# Patient Record
Sex: Female | Born: 1985 | Race: Black or African American | Hispanic: No | Marital: Single | Smoking: Current every day smoker
Health system: Southern US, Community
[De-identification: ages and names within clinical notes are randomized; demographics above are authoritative.]

## PROBLEM LIST (undated history)

## (undated) DIAGNOSIS — D649 Anemia, unspecified: Secondary | ICD-10-CM

## (undated) DIAGNOSIS — J45909 Unspecified asthma, uncomplicated: Secondary | ICD-10-CM

---

## 2020-02-18 ENCOUNTER — Observation Stay
Admission: EM | Admit: 2020-02-18 | Discharge: 2020-02-18 | Disposition: A | Discharge status: Home / Self Care | Payer: Medicaid Other | Attending: Obstetrics and Gynecology | Admitting: Obstetrics and Gynecology

## 2020-02-18 ENCOUNTER — Encounter: Payer: Self-pay | Admitting: Obstetrics and Gynecology

## 2020-02-18 ENCOUNTER — Other Ambulatory Visit: Payer: Self-pay

## 2020-02-18 DIAGNOSIS — F17200 Nicotine dependence, unspecified, uncomplicated: Secondary | ICD-10-CM | POA: Insufficient documentation

## 2020-02-18 DIAGNOSIS — R109 Unspecified abdominal pain: Secondary | ICD-10-CM | POA: Diagnosis present

## 2020-02-18 DIAGNOSIS — O26893 Other specified pregnancy related conditions, third trimester: Secondary | ICD-10-CM | POA: Diagnosis present

## 2020-02-18 DIAGNOSIS — R102 Pelvic and perineal pain: Secondary | ICD-10-CM | POA: Diagnosis not present

## 2020-02-18 DIAGNOSIS — O99333 Smoking (tobacco) complicating pregnancy, third trimester: Secondary | ICD-10-CM | POA: Insufficient documentation

## 2020-02-18 DIAGNOSIS — Z20822 Contact with and (suspected) exposure to covid-19: Secondary | ICD-10-CM | POA: Diagnosis not present

## 2020-02-18 DIAGNOSIS — R1011 Right upper quadrant pain: Secondary | ICD-10-CM | POA: Insufficient documentation

## 2020-02-18 DIAGNOSIS — Z3490 Encounter for supervision of normal pregnancy, unspecified, unspecified trimester: Secondary | ICD-10-CM

## 2020-02-18 DIAGNOSIS — Z3A36 36 weeks gestation of pregnancy: Secondary | ICD-10-CM | POA: Diagnosis not present

## 2020-02-18 HISTORY — DX: Anemia, unspecified: D64.9

## 2020-02-18 HISTORY — DX: Unspecified asthma, uncomplicated: J45.909

## 2020-02-18 LAB — URINALYSIS, COMPLETE (UACMP) WITH MICROSCOPIC
Bilirubin Urine: NEGATIVE
Glucose, UA: NEGATIVE mg/dL
Hgb urine dipstick: NEGATIVE
Ketones, ur: NEGATIVE mg/dL
Leukocytes,Ua: NEGATIVE
Nitrite: NEGATIVE
Protein, ur: NEGATIVE mg/dL
Specific Gravity, Urine: 1.011 (ref 1.005–1.030)
pH: 7 (ref 5.0–8.0)

## 2020-02-18 LAB — COMPREHENSIVE METABOLIC PANEL
ALT: 14 U/L (ref 0–44)
AST: 20 U/L (ref 15–41)
Albumin: 2.9 g/dL — ABNORMAL LOW (ref 3.5–5.0)
Alkaline Phosphatase: 82 U/L (ref 38–126)
Anion gap: 10 (ref 5–15)
BUN: 5 mg/dL — ABNORMAL LOW (ref 6–20)
CO2: 22 mmol/L (ref 22–32)
Calcium: 8.8 mg/dL — ABNORMAL LOW (ref 8.9–10.3)
Chloride: 104 mmol/L (ref 98–111)
Creatinine, Ser: 0.42 mg/dL — ABNORMAL LOW (ref 0.44–1.00)
GFR, Estimated: >60 mL/min (ref >60)
Glucose, Bld: 70 mg/dL (ref 70–99)
Potassium: 3.4 mmol/L — ABNORMAL LOW (ref 3.5–5.1)
Sodium: 136 mmol/L (ref 135–145)
Total Bilirubin: 0.9 mg/dL (ref 0.3–1.2)
Total Protein: 6.4 g/dL — ABNORMAL LOW (ref 6.5–8.1)

## 2020-02-18 LAB — WET PREP, GENITAL
Clue Cells Wet Prep HPF POC: NONE SEEN
Sperm: NONE SEEN
Trich, Wet Prep: NONE SEEN
Yeast Wet Prep HPF POC: NONE SEEN

## 2020-02-18 LAB — PROTEIN / CREATININE RATIO, URINE
Creatinine, Urine: 100 mg/dL
Protein Creatinine Ratio: 0.14 mg/mg{Cre} (ref 0.00–0.15)
Total Protein, Urine: 14 mg/dL

## 2020-02-18 LAB — CBC WITH DIFFERENTIAL/PLATELET
Abs Immature Granulocytes: 0.02 10*3/uL (ref 0.00–0.07)
Basophils Absolute: 0 10*3/uL (ref 0.0–0.1)
Basophils Relative: 0 %
Eosinophils Absolute: 0.1 10*3/uL (ref 0.0–0.5)
Eosinophils Relative: 1 %
HCT: 32.5 % — ABNORMAL LOW (ref 36.0–46.0)
Hemoglobin: 10.8 g/dL — ABNORMAL LOW (ref 12.0–15.0)
Immature Granulocytes: 0 %
Lymphocytes Relative: 35 %
Lymphs Abs: 1.7 10*3/uL (ref 0.7–4.0)
MCH: 29.3 pg (ref 26.0–34.0)
MCHC: 33.2 g/dL (ref 30.0–36.0)
MCV: 88.3 fL (ref 80.0–100.0)
Monocytes Absolute: 0.6 10*3/uL (ref 0.1–1.0)
Monocytes Relative: 11 %
Neutro Abs: 2.5 10*3/uL (ref 1.7–7.7)
Neutrophils Relative %: 53 %
Platelets: 160 10*3/uL (ref 150–400)
RBC: 3.68 MIL/uL — ABNORMAL LOW (ref 3.87–5.11)
RDW: 13.5 % (ref 11.5–15.5)
WBC: 4.8 10*3/uL (ref 4.0–10.5)
nRBC: 0 % (ref 0.0–0.2)

## 2020-02-18 LAB — RESP PANEL BY RT-PCR (FLU A&B, COVID) ARPGX2
Influenza A by PCR: NEGATIVE
Influenza B by PCR: NEGATIVE
SARS Coronavirus 2 by RT PCR: NEGATIVE

## 2020-02-18 LAB — CHLAMYDIA/NGC RT PCR (ARMC ONLY)
Chlamydia Tr: NOT DETECTED
N gonorrhoeae: NOT DETECTED

## 2020-02-18 LAB — TYPE AND SCREEN
ABO/RH(D): A POS
Antibody Screen: NEGATIVE

## 2020-02-18 LAB — URINE DRUG SCREEN, QUALITATIVE (ARMC ONLY)
Amphetamines, Ur Screen: NOT DETECTED
Barbiturates, Ur Screen: NOT DETECTED
Benzodiazepine, Ur Scrn: NOT DETECTED
Cannabinoid 50 Ng, Ur ~~LOC~~: POSITIVE — AB
Cocaine Metabolite,Ur ~~LOC~~: NOT DETECTED
MDMA (Ecstasy)Ur Screen: NOT DETECTED
Methadone Scn, Ur: NOT DETECTED
Opiate, Ur Screen: NOT DETECTED
Phencyclidine (PCP) Ur S: NOT DETECTED
Tricyclic, Ur Screen: NOT DETECTED

## 2020-02-18 LAB — ABO/RH: ABO/RH(D): A POS

## 2020-02-18 LAB — HEPATITIS B SURFACE ANTIGEN: Hepatitis B Surface Ag: NONREACTIVE

## 2020-02-18 LAB — RAPID HIV SCREEN (HIV 1/2 AB+AG)
HIV 1/2 Antibodies: NONREACTIVE
HIV-1 P24 Antigen - HIV24: NONREACTIVE

## 2020-02-18 MED ORDER — ONDANSETRON HCL 4 MG/2ML IJ SOLN
INTRAMUSCULAR | Status: AC
  Administered 2020-02-18: 4 mg
  Filled 2020-02-18: qty 2

## 2020-02-18 MED ORDER — MORPHINE SULFATE (PF) 2 MG/ML IV SOLN
2.0000 mg | Freq: Once | INTRAVENOUS | Status: AC
  Administered 2020-02-18: 2 mg via INTRAVENOUS
  Filled 2020-02-18: qty 1

## 2020-02-18 MED ORDER — MORPHINE SULFATE (PF) 2 MG/ML IV SOLN
INTRAVENOUS | Status: AC
  Administered 2020-02-18: 2 mg via INTRAVENOUS
  Filled 2020-02-18: qty 1

## 2020-02-18 MED ORDER — ONDANSETRON HCL 4 MG/2ML IJ SOLN: 4.0000 mg | Freq: Four times a day (QID) | INTRAMUSCULAR | Status: DC

## 2020-02-18 MED ORDER — FAMOTIDINE 20 MG PO TABS
ORAL_TABLET | ORAL | Status: AC
  Filled 2020-02-18: qty 2

## 2020-02-18 MED ORDER — FAMOTIDINE 20 MG PO TABS
40.0000 mg | ORAL_TABLET | Freq: Once | ORAL | Status: AC
  Administered 2020-02-18: 40 mg via ORAL

## 2020-02-18 MED ORDER — LACTATED RINGERS IV SOLN: INTRAVENOUS | Status: DC

## 2020-02-18 MED ORDER — MORPHINE SULFATE (PF) 4 MG/ML IV SOLN: 8.0000 mg | Freq: Once | INTRAVENOUS | Status: AC

## 2020-02-18 MED ORDER — MORPHINE SULFATE (PF) 4 MG/ML IV SOLN
INTRAVENOUS | Status: AC
  Administered 2020-02-18: 8 mg via INTRAMUSCULAR
  Filled 2020-02-18: qty 1

## 2020-02-18 MED ORDER — FAMOTIDINE 40 MG/5ML PO SUSR
40.0000 mg | Freq: Every day | ORAL | Status: DC
  Filled 2020-02-18: qty 5

## 2020-02-18 MED ORDER — MORPHINE SULFATE (PF) 2 MG/ML IV SOLN: 2.0000 mg | Freq: Once | INTRAVENOUS | Status: AC

## 2020-02-18 MED ORDER — BETAMETHASONE SOD PHOS & ACET 6 (3-3) MG/ML IJ SUSP
INTRAMUSCULAR | Status: AC
  Administered 2020-02-18: 12 mg via INTRAMUSCULAR
  Filled 2020-02-18: qty 5

## 2020-02-18 MED ORDER — BETAMETHASONE SOD PHOS & ACET 6 (3-3) MG/ML IJ SUSP
12.0000 mg | INTRAMUSCULAR | Status: DC
Start: 2020-02-18 — End: 2020-02-19

## 2020-02-18 MED ORDER — MORPHINE SULFATE (PF) 4 MG/ML IV SOLN
INTRAVENOUS | Status: AC
  Filled 2020-02-18: qty 1

## 2020-02-18 MED ORDER — ACETAMINOPHEN-CODEINE #3 300-30 MG PO TABS: 1.0000 | ORAL_TABLET | Freq: Four times a day (QID) | ORAL | 0 refills | Status: DC | PRN

## 2020-02-18 NOTE — Discharge Instructions (Signed)
Abdominal Pain During Pregnancy  Abdominal pain is common during pregnancy, and has many possible causes. Some causes are more serious than others, and sometimes the cause is not known. Abdominal pain can be a sign that labor is starting. It can also be caused by normal growth and stretching of muscles and ligaments during pregnancy. Always tell your health care provider if you have any abdominal pain. Follow these instructions at home:  Do not have sex or put anything in your vagina until your pain goes away completely.  Get plenty of rest until your pain improves.  Drink enough fluid to keep your urine pale yellow.  Take over-the-counter and prescription medicines only as told by your health care provider.  Keep all follow-up visits as told by your health care provider. This is important. Contact a health care provider if:  Your pain continues or gets worse after resting.  You have lower abdominal pain that: ? Comes and goes at regular intervals. ? Spreads to your back. ? Is similar to menstrual cramps.  You have pain or burning when you urinate. Get help right away if:  You have a fever or chills.  You have vaginal bleeding.  You are leaking fluid from your vagina.  You are passing tissue from your vagina.  You have vomiting or diarrhea that lasts for more than 24 hours.  Your baby is moving less than usual.  You feel very weak or faint.  You have shortness of breath.  You develop severe pain in your upper abdomen. Summary  Abdominal pain is common during pregnancy, and has many possible causes.  If you experience abdominal pain during pregnancy, tell your health care provider right away.  Follow your health care provider's home care instructions and keep all follow-up visits as directed. This information is not intended to replace advice given to you by your health care provider. Make sure you discuss any questions you have with your health care  provider. Document Revised: 05/18/2018 Document Reviewed: 05/02/2016 Elsevier Patient Education  2020 [employer]vier Inc.  

## 2020-02-18 NOTE — OB Triage Note (Signed)
Pt is a G5P4 at [redacted]w[redacted]d that present from ED with complaint of Abdominal pain that started this morning at 10am. Pt describes pain as "starting in the top of my abdomen and shoots to my vagina and feels like my butt vibrates." Pt denies LOF, VB and states positive FM. Pt had a C/S with her last pregnancy due to breech.

## 2020-02-18 NOTE — H&P (Signed)
Obstetric H&P   Chief Complaint: Abdominal pain, pelvic pain  Prenatal Care Provider: None locally - Patient recent transfer from [place]  History of Present Illness: 35 y.o. [id number] [redacted]w[redacted]d by 03/14/2020, by Last Menstrual Period presenting to L&D with concern for RUQ abdominal pain that radiates through abdomen to pelvis and vagina. Patient states she has been peeing more frequently the last two days and this morning woke with intense pain in her vagina and abdomen.  Patient reports +FM, denies VB or LOF. During initial evaluation, patient tearful and very agitated with frequent movement in bed, difficulty responding to questions regarding history. Patient states she has recently moved from [place] but has not established [facility] care due to insurance restrictions. Patient states she has not been seen for [facility] since [redacted] weeks gestation. Patient reports frequent urination but denies burning or pain with voiding. Patient denies additional S&S of PIH including headache or changes in vision. Denies N/V/D. Patient reports being treated for gonorrhea during this pregnancy. Reports unprotected sex with 1 partner, states she "thinks" he was treated for gonorrhea as well. Patient reports hx of asthma and anemia but no additional complications during this pregnancy. Patient states she has a hx of one preterm delivery that was indicated for recurrent UTIs in pregnancy. Patient states she has had one previous cesarean delivery for breech presentation.   Pregravid weight Pregravid weight not on file Total Weight Gain Not found.  pregnancy Problems (from 02/18/20 to present)    No problems associated with this episode.       Review of Systems: 10 point review of systems negative unless otherwise noted in HPI  Past Medical History: Patient Active Problem List   Diagnosis Date Noted  . Pregnancy 02/18/2020    Past Surgical History: Past Surgical History:  Procedure Laterality Date  . CESAREAN SECTION       Past Obstetric History: # 1 - Date: 10/01/06, Sex: Female, Weight: None, GA: [redacted]w[redacted]d, Delivery: Vaginal, Spontaneous, Apgar1: None, Apgar5: None, Living: Living, Birth Comments: None  # 2 - Date: 06/15/09, Sex: Female, Weight: None, GA: None, Delivery: Vaginal, Spontaneous, Apgar1: None, Apgar5: None, Living: Living, Birth Comments: None  # 3 - Date: 04/21/12, Sex: Female, Weight: None, GA: None, Delivery: Vaginal, Spontaneous, Apgar1: None, Apgar5: None, Living: Living, Birth Comments: None  # 4 - Date: 12/04/13, Sex: Female, Weight: None, GA: None, Delivery: C-Section, Low Transverse, Apgar1: None, Apgar5: None, Living: Living, Birth Comments: None  # 5 - Date: None, Sex: None, Weight: None, GA: None, Delivery: None, Apgar1: None, Apgar5: None, Living: None, Birth Comments: None   Past Gynecologic History:  Family History: History reviewed. No pertinent family history.  Social History: Social History   Socioeconomic History  . Marital status: Significant Other    Spouse name: Antonio  . Number of children: Not on file  . Years of education: Not on file  . Highest education level: Not on file  Occupational History  . Not on file  Tobacco Use  . Smoking status: Current Some Day Smoker    Last attempt to quit: 01/31/2020    Years since quitting: 0.0  . Smokeless tobacco: Never Used  Vaping Use  . Vaping Use: Not on file  Substance and Sexual Activity  . Alcohol use: Never  . Drug use: Not Currently  . Sexual activity: Yes    Birth control/protection: I.U.D.  Other Topics Concern  . Not on file  Social History Narrative  . Not on file   Social  Determinants of Health   Financial Resource Strain: Not on file  Food Insecurity: Not on file  Transportation Needs: Not on file  Physical Activity: Not on file  Stress: Not on file  Social Connections: Not on file  Intimate Partner Violence: Not on file    Medications: Prior to Admission medications   Medication Sig  Start Date End Date Taking? Authorizing Provider  ferrous sulfate 325 (65 FE) MG EC tablet Take 325 mg by mouth 3 (three) times daily with meals.   Yes [provider]  Prenatal Vit-Fe Fumarate-FA (PRENATAL MULTIVITAMIN) TABS tablet Take 1 tablet by mouth daily at 12 noon.   Yes [provider]    Allergies: Allergies  Allergen Reactions  . Shellfish Allergy Swelling    Swelling of tongue  . Shiitake Mushroom Hives  . Bee Venom Rash    Physical Exam: Vitals: Blood pressure 118/74, pulse 87, temperature 98.2 F (36.8 C), resp. rate 18, height 5' (1.524 m), weight 56.3 kg, last menstrual period 06/08/2019.  NONSTRESS TEST INTERPRETATION  INDICATIONS: rule out uterine contractions FHR baseline: 130 RESULTS:  A NST procedure was performed with FHR monitoring and a normal baseline established, appropriate time of 20-40 minutes of evaluation, and accels >2 seen w 15x15 characteristics.  Results show a REACTIVE NST.  Toco: Irregular contraction pattern noted with intermittent uterine irritability   General: Tearful and agitated with frequent movement in bed HEENT: normocephalic, anicteric Pulmonary: No increased work of breathing Cardiovascular: RRR, distal pulses 2+ Abdomen: Gravid, slight tenderness to palpation of RUQ Genitourinary: SVE: 0.5/thick/posterior/-3 (cephalic presentation confirmed by bedside US) Extremities: no edema, erythema, or tenderness Neurologic: Grossly intact Psychiatric: mood appropriate, affect full  Labs: Results for orders placed or performed during the hospital encounter of 02/18/20 (from the past 24 hour(s))  Urinalysis, Complete w Microscopic Urine, Clean Catch     Status: Abnormal   Collection Time: 02/18/20  2:19 PM  Result Value Ref Range   Color, Urine YELLOW (A) YELLOW   APPearance HAZY (A) CLEAR   Specific Gravity, Urine 1.011 1.005 - 1.030   pH 7.0 5.0 - 8.0   Glucose, UA NEGATIVE NEGATIVE mg/dL   Hgb urine dipstick  NEGATIVE NEGATIVE   Bilirubin Urine NEGATIVE NEGATIVE   Ketones, ur NEGATIVE NEGATIVE mg/dL   Protein, ur NEGATIVE NEGATIVE mg/dL   Nitrite NEGATIVE NEGATIVE   Leukocytes,Ua NEGATIVE NEGATIVE   RBC / HPF 0-5 0 - 5 RBC/hpf   WBC, UA 0-5 0 - 5 WBC/hpf   Bacteria, UA RARE (A) NONE SEEN   Squamous Epithelial / LPF 11-20 0 - 5   Mucus PRESENT   Wet prep, genital     Status: Abnormal   Collection Time: 02/18/20  2:19 PM   Specimen: Urine, Clean Catch  Result Value Ref Range   Yeast Wet Prep HPF POC NONE SEEN NONE SEEN   Trich, Wet Prep NONE SEEN NONE SEEN   Clue Cells Wet Prep HPF POC NONE SEEN NONE SEEN   WBC, Wet Prep HPF POC FEW (A) NONE SEEN   Sperm NONE SEEN   CBC with Differential/Platelet     Status: Abnormal   Collection Time: 02/18/20  2:28 PM  Result Value Ref Range   WBC 4.8 4.0 - 10.5 K/uL   RBC 3.68 (L) 3.87 - 5.11 MIL/uL   Hemoglobin 10.8 (L) 12.0 - 15.0 g/dL   HCT 16.1 (L) 09.6 - 04.5 %   MCV 88.3 80.0 - 100.0 fL   MCH 29.3 26.0 -  34.0 pg   MCHC 33.2 30.0 - 36.0 g/dL   RDW 13.5 11.5 - 15.5 %   Platelets 160 150 - 400 K/uL   nRBC 0.0 0.0 - 0.2 %   Neutrophils Relative % 53 %   Neutro Abs 2.5 1.7 - 7.7 K/uL   Lymphocytes Relative 35 %   Lymphs Abs 1.7 0.7 - 4.0 K/uL   Monocytes Relative 11 %   Monocytes Absolute 0.6 0.1 - 1.0 K/uL   Eosinophils Relative 1 %   Eosinophils Absolute 0.1 0.0 - 0.5 K/uL   Basophils Relative 0 %   Basophils Absolute 0.0 0.0 - 0.1 K/uL   Immature Granulocytes 0 %   Abs Immature Granulocytes 0.02 0.00 - 0.07 K/uL  Comprehensive metabolic panel     Status: Abnormal   Collection Time: 02/18/20  2:28 PM  Result Value Ref Range   Sodium 136 135 - 145 mmol/L   Potassium 3.4 (L) 3.5 - 5.1 mmol/L   Chloride 104 98 - 111 mmol/L   CO2 22 22 - 32 mmol/L   Glucose, Bld 70 70 - 99 mg/dL   BUN 5 (L) 6 - 20 mg/dL   Creatinine, Ser 0.42 (L) 0.44 - 1.00 mg/dL   Calcium 8.8 (L) 8.9 - 10.3 mg/dL   Total Protein 6.4 (L) 6.5 - 8.1 g/dL   Albumin  2.9 (L) 3.5 - 5.0 g/dL   AST 20 15 - 41 U/L   ALT 14 0 - 44 U/L   Alkaline Phosphatase 82 38 - 126 U/L   Total Bilirubin 0.9 0.3 - 1.2 mg/dL   GFR, Estimated >60 >60 mL/min   Anion gap 10 5 - 15  Type and screen [place]     Status: None   Collection Time: 02/18/20  2:28 PM  Result Value Ref Range   ABO/RH(D) A POS    Antibody Screen NEG    Sample Expiration      02/21/2020,2359 Performed at [facility], [place], [place] [address]   Rapid HIV screen (HIV 1/2 Ab+Ag)     Status: None   Collection Time: 02/18/20  2:28 PM  Result Value Ref Range   HIV-1 P24 Antigen - HIV24 NON REACTIVE NON REACTIVE   HIV 1/2 Antibodies NON REACTIVE NON REACTIVE   Interpretation (HIV Ag Ab)      A non reactive test result means that HIV 1 or HIV 2 antibodies and HIV 1 p24 antigen were not detected in the specimen.    Assessment: 35 y.o. [id number] 103w3d by 03/14/2020, by Last Menstrual Period who presents for acute abdominal and pelvic pain. Irregular contraction pattern with closed cervix- not currently in labor. Will place in observation for further evaluation. Obstetric hx not immediately available due to recent out of state move.   Plan: 1) Abd and pelvic pain- CBC/CMP, UA, GC/CT, wet mount collected. IV fluids and pain medication for acute discomfort.   2) Fetus - Category I - reassuring fetal status  3) Prenatal lab panel ordered  4) Disposition - Pending further evaluation.   5) Record request placed from site of [facility] in [place].  Orlie Pollen, CNM, MSN [facility], [facility] 02/18/2020, 4:09 PM

## 2020-02-18 NOTE — OB Triage Note (Signed)
Discharge instructions reviewed and pt verbalized understanding. Pt made aware to return to labor & delivery at 5:45pm tomorrow 02/19/19 for repeat betamethasone shot. Pt verbalized understanding.

## 2020-02-18 NOTE — Discharge Summary (Signed)
Physician Discharge Summary  [name] MRN: [id number] DOB/AGE: 34-09-1985 34 y.o.  Admit date: 02/18/2020 Discharge date: 02/18/2020  Admission Diagnoses: Abdominal pain in pregnancy  Discharge Diagnoses:  Active Problems:   Pregnancy   Discharged Condition: good  Hospital Course: Patient was admitted for severe abdominal pain. She was having contractions. She was observed for several hours. She was given IV and IM pain medication. This resolved her contractions and pain. She did not make cervical change. She was given betamethasone. Evaluation was negative for other sources of abdominal pain.  She was able to be discharged home in stable condition. Encouraged to establish prenatal care. Return tomorrow for second betamethasone. Rx for tylenol #3 sent. 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   Consults: None  Significant Diagnostic Studies: labs: See EPIC  Treatments: IV hydration  Discharge Exam: Blood pressure 120/62, pulse 75, temperature 98.2 F (36.8 C), resp. rate 18, height 5' (1.524 m), weight 56.3 kg, last menstrual period 06/08/2019. General appearance: alert and cooperative Resp: clear to auscultation bilaterally Chest wall: no tenderness Cardio: regular rate and rhythm, S1, S2 normal, no murmur, click, rub or gallop GI: soft, non-tender; bowel sounds normal; no masses,  no organomegaly Extremities: extremities normal, atraumatic, no cyanosis or edema Pulses: 2+ and symmetric Skin: Skin color, texture, turgor normal. No rashes or lesions  Disposition: Discharge disposition: 01-Home or Self Care        Allergies as of 02/18/2020      Reactions   Shellfish Allergy Swelling   Swelling of tongue   Shiitake Mushroom Hives   Bee Venom Rash      Medication List    TAKE these medications   acetaminophen-codeine 300-30 MG tablet Commonly known as: TYLENOL #3 Take 1 tablet by mouth every 6 (six) hours as needed for moderate  pain.   ferrous sulfate 325 (65 FE) MG EC tablet Take 325 mg by mouth 3 (three) times daily with meals.   prenatal multivitamin Tabs tablet Take 1 tablet by mouth daily at 12 noon.       Follow-up Information    [facility]. Schedule an appointment as soon as possible for a visit in 3 day(s).   Contact information: [address] [place] [address] [phone]              Signed: Natale Milch 02/18/2020, 9:03 PM

## 2020-02-19 ENCOUNTER — Observation Stay
Admission: EM | Admit: 2020-02-19 | Discharge: 2020-02-19 | Disposition: A | Discharge status: Home / Self Care | Payer: Medicaid Other | Attending: Obstetrics & Gynecology | Admitting: Obstetrics & Gynecology

## 2020-02-19 DIAGNOSIS — R109 Unspecified abdominal pain: Secondary | ICD-10-CM

## 2020-02-19 DIAGNOSIS — O26893 Other specified pregnancy related conditions, third trimester: Secondary | ICD-10-CM

## 2020-02-19 DIAGNOSIS — Z3684 Encounter for antenatal screening for fetal lung maturity: Principal | ICD-10-CM | POA: Insufficient documentation

## 2020-02-19 DIAGNOSIS — Z3A00 Weeks of gestation of pregnancy not specified: Secondary | ICD-10-CM | POA: Insufficient documentation

## 2020-02-19 LAB — RUBELLA SCREEN: Rubella: <0.9 index — ABNORMAL LOW (ref >0.99)

## 2020-02-19 LAB — RPR: RPR Ser Ql: NONREACTIVE

## 2020-02-19 MED ORDER — BETAMETHASONE SOD PHOS & ACET 6 (3-3) MG/ML IJ SUSP
12.0000 mg | Freq: Once | INTRAMUSCULAR | Status: AC
  Administered 2020-02-19: 12 mg via INTRAMUSCULAR

## 2020-02-19 MED ORDER — BETAMETHASONE SOD PHOS & ACET 6 (3-3) MG/ML IJ SUSP: 12.0000 mg | Freq: Once | INTRAMUSCULAR | Status: DC

## 2020-02-19 NOTE — OB Triage Note (Signed)
Pt presents for her second BMZ injection and provider aware of patients arrival. Discharge orders received.

## 2020-02-19 NOTE — OB Triage Note (Signed)
Pt discharged home.

## 2020-02-19 NOTE — Discharge Summary (Signed)
Visit for second BMZ injection due to risk factors for PTL  Annamarie Major, MD, [facility], [facility] 02/19/2020  9:17 PM

## 2020-02-20 ENCOUNTER — Inpatient Hospital Stay
Admission: EM | Admit: 2020-02-20 | Discharge: 2020-02-20 | Disposition: A | Discharge status: Home / Self Care | Payer: Medicaid Other | Attending: Obstetrics & Gynecology | Admitting: Obstetrics & Gynecology

## 2020-02-20 ENCOUNTER — Other Ambulatory Visit: Payer: Self-pay

## 2020-02-20 ENCOUNTER — Encounter: Payer: Self-pay | Admitting: Obstetrics & Gynecology

## 2020-02-20 DIAGNOSIS — O26893 Other specified pregnancy related conditions, third trimester: Secondary | ICD-10-CM

## 2020-02-20 DIAGNOSIS — R109 Unspecified abdominal pain: Secondary | ICD-10-CM

## 2020-02-20 DIAGNOSIS — O26853 Spotting complicating pregnancy, third trimester: Secondary | ICD-10-CM | POA: Insufficient documentation

## 2020-02-20 DIAGNOSIS — Z3A36 36 weeks gestation of pregnancy: Secondary | ICD-10-CM | POA: Insufficient documentation

## 2020-02-20 DIAGNOSIS — O4693 Antepartum hemorrhage, unspecified, third trimester: Secondary | ICD-10-CM

## 2020-02-20 LAB — CULTURE, BETA STREP (GROUP B ONLY)

## 2020-02-20 LAB — URINE CULTURE

## 2020-02-20 NOTE — Progress Notes (Signed)
Pt discharged home per Tiburcio Pea, MDorder.  Pt stable and ambulatory. An After Visit Summary was printed and given to the patient. Discharge education completed with patient/family including follow up instructions, medication list, d/c activities limitations if indicated, with other d/c instructions as indicated by MD . Pt received labor and bleeding precautions. Patient able to verbalize understanding, all questions fully answered. Pt educated on no longer checking her cervix.  Patient instructed to return to ED, call 911, or call MD for any changes in condition. Pt discharged home via personal vehicle with support person.

## 2020-02-20 NOTE — Discharge Summary (Signed)
  See FPN 

## 2020-02-20 NOTE — OB Triage Note (Addendum)
Pt Elizabeth Mcneil 35 y.o. presents to the ED complaining of ctx, vaginal spotting at 0300, pelvic pressure, and nausea . Pt is a [id number] at [redacted]w[redacted]d . Pt denies signs and symptons consistent with rupture of membranes or active vaginal bleeding. Pt denies contractions but reports "I feel her head on my pelvic bone and so much pressure" and states positive fetal movement. Pt reports lower mid pelvic pain 7/10. Pt states she attempted to check her cervix and while checking her cervix she scratched inside of her vagina and her a small amount of bleeding. Pt showed RN picture of the vaginal bleeding she noted at home. Bleeding was scant and brown in color. No bleeding noted in L&D when pt wiped.  External FM and TOCO applied to non-tender abdomen and assessing. Initial FHR 130 . Vital signs obtained and within normal limits. Provider notified of pt.

## 2020-02-20 NOTE — Final Progress Note (Signed)
Physician Final Progress Note  [name] MRN: [id number] DOB/AGE: [birth year] 34 y.o.  Admit date: 02/20/2020 Admitting provider: Nadara Mustard, MD Discharge date: 02/20/2020   Admission Diagnoses: Pain and bleeding in third trimester  Discharge Diagnoses: same  Consults: None  Significant Findings/ Diagnostic Studies: Patient presented for evaluation of labor.  Patient had cervical exam by RN and this was reported to me. I reviewed her vital signs and fetal tracing, both of which were reassuring.  Patient was discharge as she was not laboring.  Procedures: A NST procedure was performed with FHR monitoring and a normal baseline established, appropriate time of 20-40 minutes of evaluation, and accels >2 seen w 15x15 characteristics.  Results show a REACTIVE NST.   Discharge Condition: good  Disposition: Discharge disposition: 01-Home or Self Care       Diet: Regular diet  Discharge Activity: Activity as tolerated   Allergies as of 02/20/2020      Reactions   Shellfish Allergy Swelling   Swelling of tongue   Shiitake Mushroom Hives   Bee Venom Rash      Medication List    TAKE these medications   acetaminophen-codeine 300-30 MG tablet Commonly known as: TYLENOL #3 Take 1 tablet by mouth every 6 (six) hours as needed for moderate pain.   ferrous sulfate 325 (65 FE) MG EC tablet Take 325 mg by mouth 3 (three) times daily with meals.   prenatal multivitamin Tabs tablet Take 1 tablet by mouth daily at 12 noon.        Total time spent taking care of this patient: TRIAGE  Signed: Letitia Libra 02/20/2020, 10:23 PM

## 2020-12-13 ENCOUNTER — Emergency Department
Admission: EM | Admit: 2020-12-13 | Discharge: 2020-12-13 | Disposition: A | Discharge status: Home / Self Care | Payer: Medicaid Other | Attending: Emergency Medicine | Admitting: Emergency Medicine

## 2020-12-13 ENCOUNTER — Other Ambulatory Visit: Payer: Self-pay

## 2020-12-13 DIAGNOSIS — K0889 Other specified disorders of teeth and supporting structures: Secondary | ICD-10-CM | POA: Insufficient documentation

## 2020-12-13 DIAGNOSIS — J45909 Unspecified asthma, uncomplicated: Secondary | ICD-10-CM | POA: Insufficient documentation

## 2020-12-13 DIAGNOSIS — R6883 Chills (without fever): Secondary | ICD-10-CM | POA: Insufficient documentation

## 2020-12-13 DIAGNOSIS — F17210 Nicotine dependence, cigarettes, uncomplicated: Secondary | ICD-10-CM | POA: Diagnosis not present

## 2020-12-13 LAB — COMPREHENSIVE METABOLIC PANEL
ALT: 14 U/L (ref 0–44)
AST: 16 U/L (ref 15–41)
Albumin: 3.8 g/dL (ref 3.5–5.0)
Alkaline Phosphatase: 40 U/L (ref 38–126)
Anion gap: 8 (ref 5–15)
BUN: 17 mg/dL (ref 6–20)
CO2: 25 mmol/L (ref 22–32)
Calcium: 9 mg/dL (ref 8.9–10.3)
Chloride: 107 mmol/L (ref 98–111)
Creatinine, Ser: 0.59 mg/dL (ref 0.44–1.00)
GFR, Estimated: >60 mL/min (ref >60)
Glucose, Bld: 93 mg/dL (ref 70–99)
Potassium: 3.8 mmol/L (ref 3.5–5.1)
Sodium: 140 mmol/L (ref 135–145)
Total Bilirubin: 0.6 mg/dL (ref 0.3–1.2)
Total Protein: 7 g/dL (ref 6.5–8.1)

## 2020-12-13 LAB — CBC WITH DIFFERENTIAL/PLATELET
Abs Immature Granulocytes: 0.02 10*3/uL (ref 0.00–0.07)
Basophils Absolute: 0.1 10*3/uL (ref 0.0–0.1)
Basophils Relative: 1 %
Eosinophils Absolute: 0.3 10*3/uL (ref 0.0–0.5)
Eosinophils Relative: 5 %
HCT: 40.7 % (ref 36.0–46.0)
Hemoglobin: 14 g/dL (ref 12.0–15.0)
Immature Granulocytes: 0 %
Lymphocytes Relative: 31 %
Lymphs Abs: 1.7 10*3/uL (ref 0.7–4.0)
MCH: 30 pg (ref 26.0–34.0)
MCHC: 34.4 g/dL (ref 30.0–36.0)
MCV: 87.3 fL (ref 80.0–100.0)
Monocytes Absolute: 0.5 10*3/uL (ref 0.1–1.0)
Monocytes Relative: 9 %
Neutro Abs: 3 10*3/uL (ref 1.7–7.7)
Neutrophils Relative %: 54 %
Platelets: 339 10*3/uL (ref 150–400)
RBC: 4.66 MIL/uL (ref 3.87–5.11)
RDW: 14.7 % (ref 11.5–15.5)
WBC: 5.4 10*3/uL (ref 4.0–10.5)
nRBC: 0 % (ref 0.0–0.2)

## 2020-12-13 MED ORDER — CLINDAMYCIN HCL 300 MG PO CAPS: 300.0000 mg | ORAL_CAPSULE | Freq: Three times a day (TID) | ORAL | 0 refills | Status: AC

## 2020-12-13 NOTE — ED Triage Notes (Signed)
Pt states she had 3 teeth removed a [facility] on Monday and is currently on abx, pt states she has been having chills and hot with a HA since. Pt is in NAD.

## 2020-12-13 NOTE — ED Provider Notes (Signed)
[facility] Emergency Department Provider Note   ____________________________________________   Event Date/Time   First MD Initiated Contact with Patient 12/13/20 1006     (approximate)  I have reviewed the triage vital signs and the nursing notes.   HISTORY  Chief Complaint Chills    HPI Elizabeth Mcneil is a 35 y.o. female who presents for right mandibular dental pain and chills  LOCATION: Right mandibular molars DURATION: 2 days prior to arrival TIMING: Worsening since onset SEVERITY: Moderate QUALITY: Aching pain and chills CONTEXT: Patient had 3 molars extracted on the right mandibular region on 12/04/2020 in [facility] dentistry.  2 days prior to arrival she began having worsening pain despite being on Augmentin as well as subjective chills without pyrexia. MODIFYING FACTORS: Patient denies any exacerbating or relieving factors ASSOCIATED SYMPTOMS: Chills   Per medical record review, patient has history of of anemia          Past Medical History:  Diagnosis Date   Anemia    Asthma     Patient Active Problem List   Diagnosis Date Noted   Indication for care in labor and delivery, antepartum 02/19/2020   Pregnancy 02/18/2020   Abdominal pain in pregnancy, third trimester 02/18/2020   Pelvic pain in pregnancy, antepartum, third trimester 02/18/2020    Past Surgical History:  Procedure Laterality Date   CESAREAN SECTION      Prior to Admission medications   Medication Sig Start Date End Date Taking? Authorizing Provider  clindamycin (CLEOCIN) 300 MG capsule Take 1 capsule (300 mg total) by mouth 3 (three) times daily for 5 days. 12/13/20 12/18/20 Yes Aston Lawhorn, Clent Jacks, MD  acetaminophen-codeine (TYLENOL #3) 300-30 MG tablet Take 1 tablet by mouth every 6 (six) hours as needed for moderate pain. 02/18/20   Schuman, Jaquelyn Bitter, MD  ferrous sulfate 325 (65 FE) MG EC tablet Take 325 mg by mouth 3 (three) times daily with meals.    [provider]  Prenatal Vit-Fe Fumarate-FA (PRENATAL MULTIVITAMIN) TABS tablet Take 1 tablet by mouth daily at 12 noon.    [provider]    Allergies Shellfish allergy, Shiitake mushroom, and Bee venom  No family history on file.  Social History Social History   Tobacco Use   Smoking status: Some Days    Types: Cigarettes    Last attempt to quit: 01/31/2020    Years since quitting: 0.8   Smokeless tobacco: Never  Substance Use Topics   Alcohol use: Never   Drug use: Not Currently    Review of Systems Constitutional: Endorses subjective chills and fever Eyes: No visual changes. ENT: No sore throat.  Endorses right mandibular dental pain Cardiovascular: Denies chest pain. Respiratory: Denies shortness of breath. Gastrointestinal: No abdominal pain.  No nausea, no vomiting.  No diarrhea. Genitourinary: Negative for dysuria. Musculoskeletal: Negative for acute arthralgias Skin: Negative for rash. Neurological: Negative for headaches, weakness/numbness/paresthesias in any extremity Psychiatric: Negative for suicidal ideation/homicidal ideation   ____________________________________________   PHYSICAL EXAM:  VITAL SIGNS: ED Triage Vitals  Enc Vitals Group     BP 12/13/20 0938 116/68     Pulse Rate 12/13/20 0938 68     Resp 12/13/20 0938 16     Temp 12/13/20 0938 98.4 F (36.9 C)     Temp Source 12/13/20 0937 Oral     SpO2 12/13/20 0938 99 %     Weight 12/13/20 0937 116 lb (52.6 kg)     Height 12/13/20 0937 5' (1.524  m)     Head Circumference --      Peak Flow --      Pain Score 12/13/20 0937 6     Pain Loc --      Pain Edu? --      Excl. in GC? --    Constitutional: Alert and oriented. Well appearing and in no acute distress. Eyes: Conjunctivae are normal. PERRL. Head: Atraumatic. Nose: No congestion/rhinnorhea. Mouth/Throat: Mucous membranes are moist.  Teeth 31, 30, 29 removed without significant erythema or any active drainage from the  underlying tissue Neck: No stridor Cardiovascular: Grossly normal heart sounds.  Good peripheral circulation. Respiratory: Normal respiratory effort.  No retractions. Gastrointestinal: Soft and nontender. No distention. Musculoskeletal: No obvious deformities Neurologic:  Normal speech and language. No gross focal neurologic deficits are appreciated. Skin:  Skin is warm and dry. No rash noted. Psychiatric: Mood and affect are normal. Speech and behavior are normal.  ____________________________________________   LABS (all labs ordered are listed, but only abnormal results are displayed)  Labs Reviewed  CBC WITH DIFFERENTIAL/PLATELET  COMPREHENSIVE METABOLIC PANEL    PROCEDURES  Procedure(s) performed (including Critical Care):  Procedures   ____________________________________________   INITIAL IMPRESSION / ASSESSMENT AND PLAN / ED COURSE  As part of my medical decision making, I reviewed the following data within the electronic medical record, if available:  Nursing notes reviewed and incorporated, Labs reviewed, EKG interpreted, Old chart reviewed, Radiograph reviewed and Notes from prior ED visits reviewed and incorporated        Patient not immunosuppressed. No e/o tooth fracture, avulsion, or bleeding socket. No e/o RPA, PTA, Ludwigs angina, periapical abscess. No e/o gingival hyperplasia or concern for drug reaction.  Rx Ibuprofen.  We will change antibiotics from Augmentin to clindamycin due to possible interaction. Disposition: Discharge home. Discussed return precautions for odontogenic infections and other dental pain emergencies. Will provide dental clinic list.      ____________________________________________   FINAL CLINICAL IMPRESSION(S) / ED DIAGNOSES  Final diagnoses:  Pain, dental  Chills     ED Discharge Orders          Ordered    clindamycin (CLEOCIN) 300 MG capsule  3 times daily        12/13/20 1143             Note:  This  document was prepared using Dragon voice recognition software and may include unintentional dictation errors.    Merwyn Katos, MD 12/13/20 [phone]

## 2020-12-13 NOTE — ED Notes (Addendum)
Says for about a week she has hot and cold chills, migraines.  Says no cough, but she has nasal congestion not releived by nose sprays.  She is alert and oriented and in nad.  Ambulated to room. Says her job made her leave today.  She had teeth romoved last Monday and is on augmentin for that.   Also regularly donates plasma.

## 2020-12-29 ENCOUNTER — Encounter: Payer: Self-pay | Admitting: Family Medicine

## 2020-12-29 ENCOUNTER — Ambulatory Visit: Payer: Medicaid Other | Admitting: Family Medicine

## 2020-12-29 ENCOUNTER — Other Ambulatory Visit: Payer: Self-pay

## 2020-12-29 DIAGNOSIS — Z113 Encounter for screening for infections with a predominantly sexual mode of transmission: Secondary | ICD-10-CM

## 2020-12-29 LAB — WET PREP FOR TRICH, YEAST, CLUE
Trichomonas Exam: NEGATIVE
Yeast Exam: NEGATIVE

## 2020-12-29 LAB — HEPATITIS B SURFACE ANTIGEN

## 2020-12-29 LAB — HM HEPATITIS C SCREENING LAB: HM Hepatitis Screen: NEGATIVE

## 2020-12-29 LAB — HM HIV SCREENING LAB: HM HIV Screening: NEGATIVE

## 2021-01-01 NOTE — Progress Notes (Signed)
[facility] Department STI clinic/screening visit  Subjective:  Elizabeth Mcneil is a 35 y.o. female being seen today for an STI screening visit. The patient reports they do have symptoms.  Patient reports that they do not desire a pregnancy in the next year.   They reported they are not interested in discussing contraception today.  No LMP recorded. Patient has had an implant.   Patient has the following medical conditions:   Patient Active Problem List   Diagnosis Date Noted   Indication for care in labor and delivery, antepartum 02/19/2020   Pregnancy 02/18/2020   Abdominal pain in pregnancy, third trimester 02/18/2020   Pelvic pain in pregnancy, antepartum, third trimester 02/18/2020    Chief Complaint  Patient presents with   SEXUALLY TRANSMITTED DISEASE    screening    HPI  Patient reports here  for screening, reports s/sx.    Last HIV test per patient/review of record was 02/18/2020 Patient reports last pap was 05/01/2020.   See flowsheet for further details and programmatic requirements.    The following portions of the patient's history were reviewed and updated as appropriate: allergies, current medications, past medical history, past social history, past surgical history and problem list.  Objective:  There were no vitals filed for this visit.  Physical Exam Vitals and nursing note reviewed.  Constitutional:      Appearance: Normal appearance.  HENT:     Head: Normocephalic and atraumatic.     Mouth/Throat:     Mouth: Mucous membranes are moist.     Pharynx: Oropharynx is clear. No oropharyngeal exudate or posterior oropharyngeal erythema.  Pulmonary:     Effort: Pulmonary effort is normal.  Abdominal:     General: Abdomen is flat.     Palpations: There is no mass.     Tenderness: There is no abdominal tenderness. There is no rebound.  Genitourinary:    General: Normal vulva.     Exam position: Lithotomy position.     Pubic Area: No rash or  pubic lice.      Labia:        Right: No rash or lesion.        Left: No rash or lesion.      Vagina: Normal. No vaginal discharge, erythema, bleeding or lesions.     Cervix: No cervical motion tenderness, discharge, friability, lesion or erythema.     Uterus: Normal.      Adnexa: Right adnexa normal and left adnexa normal.     Rectum: Normal.     Comments: External genitalia without, lice, nits, erythema, edema , lesions or inguinal adenopathy. Vagina with normal mucosa and white discharge and pH equals 4.  Cervix without visual lesions, uterus firm, mobile, non-tender, no masses, CMT adnexal fullness or tenderness.   Musculoskeletal:     Cervical back: Normal range of motion and neck supple.  Lymphadenopathy:     Head:     Right side of head: No preauricular or posterior auricular adenopathy.     Left side of head: No preauricular or posterior auricular adenopathy.     Cervical: No cervical adenopathy.     Upper Body:     Right upper body: No supraclavicular or axillary adenopathy.     Left upper body: No supraclavicular or axillary adenopathy.     Lower Body: No right inguinal adenopathy. No left inguinal adenopathy.  Skin:    General: Skin is warm and dry.     Findings: No rash.  Neurological:  Mental Status: She is alert and oriented to person, place, and time.  Psychiatric:        Behavior: Behavior normal.     Assessment and Plan:  Elizabeth Mcneil is a 35 y.o. female presenting to the [facility] Department for STI screening  1. Screening examination for venereal disease Patient accepted all screenings including wet prep, vaginal CT/GC and bloodwork for HIV/RPR.  Patient meets criteria for HepB screening? Yes. Ordered? Yes Patient meets criteria for HepC screening? Yes. Ordered? Yes  Wet prep results neg     No Treatment needed   Discuss with patient normal vaginal d/c and changes with ovulation, sex  and when on birth control.    Discussed time line for  State [facility] results and that patient will be called with positive results and encouraged patient to call if she had not heard in 2 weeks.  Counseled to return or seek care for continued or worsening symptoms Recommended condom use with all sex  Patient is currently using *Nexplanon to prevent pregnancy.   - Chlamydia/Gonorrhea [facility] - HBV Antigen/Antibody [facility] - HIV/HCV [facility] - Syphilis Serology, [facility] - WET PREP FOR TRICH, YEAST, CLUE     Return for as needed.  No future appointments.  Wendi Snipes, FNP

## 2021-04-13 ENCOUNTER — Other Ambulatory Visit: Payer: Self-pay

## 2021-04-13 ENCOUNTER — Encounter: Payer: Self-pay | Admitting: Emergency Medicine

## 2021-04-13 ENCOUNTER — Emergency Department: Payer: Medicaid Other

## 2021-04-13 ENCOUNTER — Emergency Department
Admission: EM | Admit: 2021-04-13 | Discharge: 2021-04-13 | Disposition: A | Discharge status: Home / Self Care | Payer: Medicaid Other | Attending: Emergency Medicine | Admitting: Emergency Medicine

## 2021-04-13 DIAGNOSIS — R52 Pain, unspecified: Secondary | ICD-10-CM

## 2021-04-13 DIAGNOSIS — M778 Other enthesopathies, not elsewhere classified: Secondary | ICD-10-CM | POA: Diagnosis not present

## 2021-04-13 DIAGNOSIS — M779 Enthesopathy, unspecified: Secondary | ICD-10-CM

## 2021-04-13 DIAGNOSIS — M79631 Pain in right forearm: Secondary | ICD-10-CM | POA: Diagnosis present

## 2021-04-13 MED ORDER — PREDNISONE 10 MG (21) PO TBPK: ORAL_TABLET | ORAL | 0 refills | Status: AC

## 2021-04-13 NOTE — ED Notes (Signed)
See triage note  presents with pain to right arm  states pain starts at elbow and moves into forearm  increased pain with movement  denies any injury  good pulses ?

## 2021-04-13 NOTE — ED Triage Notes (Signed)
C/O right elbow pain to right forearm x 1 week. ?

## 2021-04-13 NOTE — ED Provider Notes (Signed)
? ?[facility] ?Provider Note ? ? ? Event Date/Time  ? First MD Initiated Contact with Patient 04/13/21 [phone]   ?  (approximate) ? ? ?History  ? ?Arm Pain ? ? ?HPI ? ?Elizabeth Mcneil is a 36 y.o. female presents emergency department complaining of right forearm pain.  Patient donates at plasma center and has had difficulty extending and bending the arm.  States she can hardly use it.  No redness.  Does have swelling at the antecubital.  Denies fever or chills.  No chest pain or shortness of breath ? ?  ? ? ?Physical Exam  ? ?Triage Vital Signs: ?ED Triage Vitals  ?Enc Vitals Group  ?   BP 04/13/21 0731 103/81  ?   Pulse Rate 04/13/21 0731 73  ?   Resp 04/13/21 0731 15  ?   Temp 04/13/21 0730 98.4 ?F (36.9 ?C)  ?   Temp Source 04/13/21 0730 Oral  ?   SpO2 04/13/21 0731 100 %  ?   Weight 04/13/21 0729 93 lb 12.9 oz (42.5 kg)  ?   Height 04/13/21 0729 5' (1.524 m)  ?   Head Circumference --   ?   Peak Flow --   ?   Pain Score 04/13/21 0729 7  ?   Pain Loc --   ?   Pain Edu? --   ?   Excl. in [place]? --   ? ? ?Most recent vital signs: ?Vitals:  ? 04/13/21 0730 04/13/21 0731  ?BP:  103/81  ?Pulse:  73  ?Resp:  15  ?Temp: 98.4 ?F (36.9 ?C)   ?SpO2:  100%  ? ? ? ?General: Awake, no distress.   ?CV:  Good peripheral perfusion. regular rate and  rhythm ?Resp:  Normal effort. Lungs CTA ?Abd:  No distention.   ?Other:  Right forearm with noted scar tissue from plasma donation, area is tender to palpation, tender along the epicondyles also, neurovascular is intact, decreased range of motion secondary discomfort ? ? ?ED Results / Procedures / Treatments  ? ?Labs ?(all labs ordered are listed, but only abnormal results are displayed) ?Labs Reviewed - No data to display ? ? ?EKG ? ? ? ? ?RADIOLOGY ?Ultrasound right upper extremity for DVT ? ? ? ?PROCEDURES: ? ? ?Procedures ? ? ?MEDICATIONS ORDERED IN ED: ?Medications - No data to display ? ? ?IMPRESSION / MDM / ASSESSMENT AND PLAN / ED COURSE  ?I reviewed the triage  vital signs and the nursing notes. ?             ?               ? ?Differential diagnosis includes, but is not limited to, DVT, tendinitis, abscess ? ?Ultrasound for DVT was reviewed by me independently.  Appears to be normal to me.  Radiologist is read as negative for DVT. ? ?I did explain findings to the patient.  She is placed on Sterapred to decrease inflammation.  She is to take Tylenol for pain.  Follow-up with her regular doctor if not improving in 3 days.  Follow-up with orthopedics if not improving in 1 week.  She is in agreement treatment plan.  Patient was discharged stable condition ? ? ? ? ?  ? ? ?FINAL CLINICAL IMPRESSION(S) / ED DIAGNOSES  ? ?Final diagnoses:  ?Pain  ?Tendonitis  ? ? ? ?Rx / DC Orders  ? ?ED Discharge Orders   ? ?      Ordered  ?  predniSONE (STERAPRED UNI-PAK 21 TAB) 10 MG (21) TBPK tablet       ? 04/13/21 1029  ? ?  ?  ? ?  ? ? ? ?Note:  This document was prepared using Dragon voice recognition software and may include unintentional dictation errors. ? ?  ?Versie Starks, PA-C ?04/13/21 1034 ? ?  ?Lucrezia Starch, MD ?04/13/21 1410 ? ?

## 2021-04-13 NOTE — Discharge Instructions (Signed)
Follow-up with your regular doctor if not improving 3 days.  Return emergency department worsening.  Apply ice to the affected area.  Take the medication occasionally as prescribed.  This should help decrease inflammation of the tendons and muscle tissue. ?

## 2021-10-09 ENCOUNTER — Emergency Department: Payer: BLUE CROSS/BLUE SHIELD

## 2021-10-09 ENCOUNTER — Other Ambulatory Visit: Payer: Self-pay

## 2021-10-09 ENCOUNTER — Emergency Department
Admission: EM | Admit: 2021-10-09 | Discharge: 2021-10-09 | Disposition: A | Discharge status: Home / Self Care | Payer: BLUE CROSS/BLUE SHIELD | Attending: Emergency Medicine | Admitting: Emergency Medicine

## 2021-10-09 DIAGNOSIS — L02415 Cutaneous abscess of right lower limb: Secondary | ICD-10-CM | POA: Insufficient documentation

## 2021-10-09 MED ORDER — LIDOCAINE-EPINEPHRINE 1 %-1:100000 IJ SOLN
10.0000 mL | Freq: Once | INTRAMUSCULAR | Status: AC
  Administered 2021-10-09: 10 mL via INTRADERMAL
  Filled 2021-10-09: qty 1

## 2021-10-09 MED ORDER — SULFAMETHOXAZOLE-TRIMETHOPRIM 800-160 MG PO TABS: 1.0000 | ORAL_TABLET | Freq: Two times a day (BID) | ORAL | 0 refills | Status: AC

## 2021-10-09 MED ORDER — SULFAMETHOXAZOLE-TRIMETHOPRIM 800-160 MG PO TABS
1.0000 | ORAL_TABLET | Freq: Once | ORAL | Status: AC
  Administered 2021-10-09: 1 via ORAL
  Filled 2021-10-09: qty 1

## 2021-10-09 NOTE — ED Triage Notes (Signed)
Pt had a red swollen are to right calf last Thursday. Area has gotten worse is now red, swollen, hot to the touch and going down into her right ankle.

## 2021-10-09 NOTE — Discharge Instructions (Signed)
Please remove packing in 2 days, follow-up with primary care provider, urgent care, walk-in clinic in 2 days for recheck.  Return to the ER for any increasing pain swelling warmth redness or fevers.

## 2021-10-09 NOTE — ED Provider Notes (Signed)
[facility] EMERGENCY DEPARTMENT Provider Note   CSN: [id number] Arrival date & time: 10/09/21  1341     History  Chief Complaint  Patient presents with   Abscess    Elizabeth Mcneil is a 36 y.o. female.  Presents to the emergency department for evaluation of right lower leg abscess.  She has had abscess for 5 days.  No known trauma or injury.  No bug bites or insect bites.  She describes some tenderness, swelling and pain.  She has a focal area of redness, swelling and induration along the right distal lateral leg.  HPI     Home Medications Prior to Admission medications   Medication Sig Start Date End Date Taking? Authorizing Provider  ferrous sulfate 325 (65 FE) MG EC tablet Take 325 mg by mouth 3 (three) times daily with meals.    [provider]  predniSONE (STERAPRED UNI-PAK 21 TAB) 10 MG (21) TBPK tablet Take 6 pills on day one then decrease by 1 pill each day 04/13/21   Faythe Ghee, PA-C  Prenatal Vit-Fe Fumarate-FA (PRENATAL MULTIVITAMIN) TABS tablet Take 1 tablet by mouth daily at 12 noon.    [provider]      Allergies    Nickel, Shellfish allergy, Shiitake mushroom, and Bee venom    Review of Systems   Review of Systems  Physical Exam Updated Vital Signs BP 103/83   Pulse 92   Temp 99 F (37.2 C) (Oral)   Resp 18   Ht 5\' 2"  (1.575 m)   Wt 48.5 kg   LMP 08/28/2021 (Approximate)   SpO2 96%   BMI 19.57 kg/m  Physical Exam Constitutional:      Appearance: She is well-developed.  HENT:     Head: Normocephalic and atraumatic.  Eyes:     Conjunctiva/sclera: Conjunctivae normal.  Cardiovascular:     Rate and Rhythm: Normal rate.  Pulmonary:     Effort: Pulmonary effort is normal. No respiratory distress.  Musculoskeletal:        General: Normal range of motion.     Cervical back: Normal range of motion.     Comments: 3 x 3 cm area of induration along the distal third of the right anterior lateral shin.  There  is slight fluctuance, no drainage.  Area is erythematous with well-demarcated borders.  There is no streaking.  Calf is soft nontender, compartments are soft.  Neuro vas intact in right lower extremity  Skin:    General: Skin is warm.     Findings: No rash.  Neurological:     Mental Status: She is alert and oriented to person, place, and time.  Psychiatric:        Behavior: Behavior normal.        Thought Content: Thought content normal.     ED Results / Procedures / Treatments   Labs (all labs ordered are listed, but only abnormal results are displayed) Labs Reviewed - No data to display  EKG None  Radiology DG Tibia/Fibula Right  Result Date: 10/09/2021 CLINICAL DATA:  Right lower extremity swelling. EXAM: RIGHT TIBIA AND FIBULA - 2 VIEW COMPARISON:  None Available. FINDINGS: There is no evidence of fracture or other focal bone lesions. Soft tissues are unremarkable. IMPRESSION: Negative. Electronically Signed   By: 10/11/2021 M.D.   On: 10/09/2021 14:40    Procedures .08/31/2023Incision and Drainage  Date/Time: 10/09/2021 3:43 PM  Performed by: 10/11/2021, PA-C Authorized by: Evon Slack,  PA-C   Consent:    Consent obtained:  Verbal   Consent given by:  Patient   Alternatives discussed:  No treatment Universal protocol:    Patient identity confirmed:  Verbally with patient Location:    Type:  Abscess   Size:  3 x 3   Location:  Lower extremity   Lower extremity location:  Leg   Leg location:  R lower leg Pre-procedure details:    Skin preparation:  Antiseptic wash and chlorhexidine with alcohol Anesthesia:    Anesthesia method:  Topical application and local infiltration   Local anesthetic:  Lidocaine 1% WITH epi Procedure type:    Complexity:  Simple Procedure details:    Ultrasound guidance: yes     Needle aspiration: no     Incision types:  Stab incision   Incision depth:  Dermal   Wound management:  Irrigated with saline   Drainage:  Purulent    Drainage amount:  Moderate   Packing materials:  1/4 in iodoform gauze Post-procedure details:    Procedure completion:  Tolerated     Medications Ordered in ED Medications  lidocaine-EPINEPHrine (XYLOCAINE W/EPI) 1 %-1:100000 (with pres) injection 10 mL (has no administration in time range)  sulfamethoxazole-trimethoprim (BACTRIM DS) 800-160 MG per tablet 1 tablet (has no administration in time range)    ED Course/ Medical Decision Making/ A&P                           Medical Decision Making Risk Prescription drug management.   36 year old female with right lower leg abscess, abscess was confirmed fluctuant with ultrasound.  Ultrasound was used to identify area of fluctuance and a small stab incision was used to open the wound and purulent material was drained.  Patient tolerated procedure well and did see improvement in overall pain and swelling.  Iodoform packing was placed and she is educated on wound care and placed on oral antibiotics.  She will take antibiotics for 10 days and she understands signs and symptoms return to the ER for.  She will remove packing on her own in 2 days or if do not feel comfortable with this she will follow-up in 2 days with walk-in clinic, urgent care or ER for recheck and packing removal Final Clinical Impression(s) / ED Diagnoses Final diagnoses:  Abscess of right leg    Rx / DC Orders ED Discharge Orders     None         Ronnette Juniper 10/09/21 1607    Dionne Bucy, MD 10/09/21 2308

## 2021-10-09 NOTE — ED Provider Triage Note (Signed)
Emergency Medicine Provider Triage Evaluation Note  Kristyana Notte , a 36 y.o. female  was evaluated in triage.  Pt complains of redness and swelling to the right side of the leg.  Patient states been burning tingling..  Review of Systems  Positive: Redness of the right leg Negative: Fever chills  Physical Exam  BP 103/83   Pulse 92   Temp 99 F (37.2 C) (Oral)   Resp 18   Ht 5\' 2"  (1.575 m)   Wt 48.5 kg   LMP 08/28/2021 (Approximate)   SpO2 96%   BMI 19.57 kg/m  Gen:   Awake, no distress   Resp:  Normal effort  MSK:   Moves extremities without difficulty, redness noted in the patient's lateral aspect of the leg typical of an abscess.  No calf tenderness posteriorly Other:    Medical Decision Making  Medically screening exam initiated at 2:12 PM.  Appropriate orders placed.  Aileena Iglesia was informed that the remainder of the evaluation will be completed by another provider, this initial triage assessment does not replace that evaluation, and the importance of remaining in the ED until their evaluation is complete.  X-ray of the right tib-fib   Virl Axe, PA-C 10/09/21 1412

## 2022-02-11 HISTORY — PX: TOTAL HIP ARTHROPLASTY: SHX124

## 2022-09-17 LAB — AMB RESULTS CONSOLE CBG: Glucose: 93

## 2022-09-18 NOTE — Progress Notes (Signed)
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 Has PCP with Bernestine Amass but wants a new provider and has several SDOH needs. Please follow up on SDOH needs specifically (questionnaire sheet is missing)

## 2022-09-26 ENCOUNTER — Ambulatory Visit: Payer: 59

## 2022-10-06 IMAGING — US US EXTREM  UP VENOUS*R*
1 series · 13 of 24 positions shown · non-contrast
Comparison: None.

CLINICAL DATA: Right upper extremity pain.



[Series 1: us venous img upper uni right (dvt) · portal-venous · 13 of 34 slices shown]
[im 1/34]
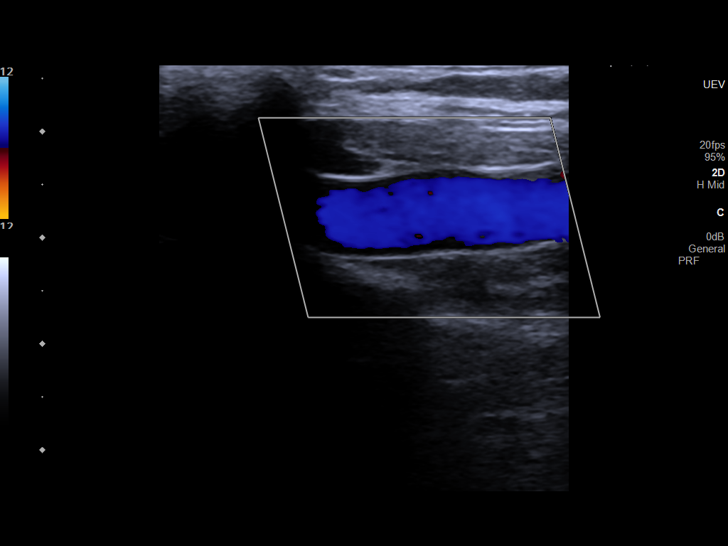
[im 3/34]
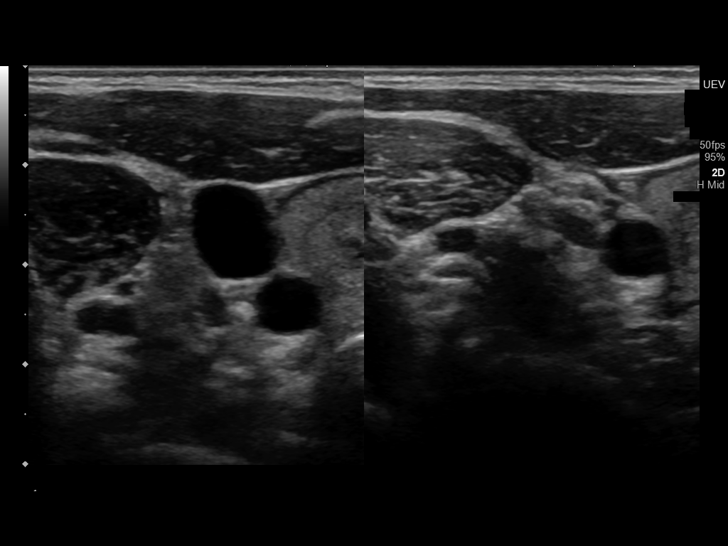
[im 6/34]
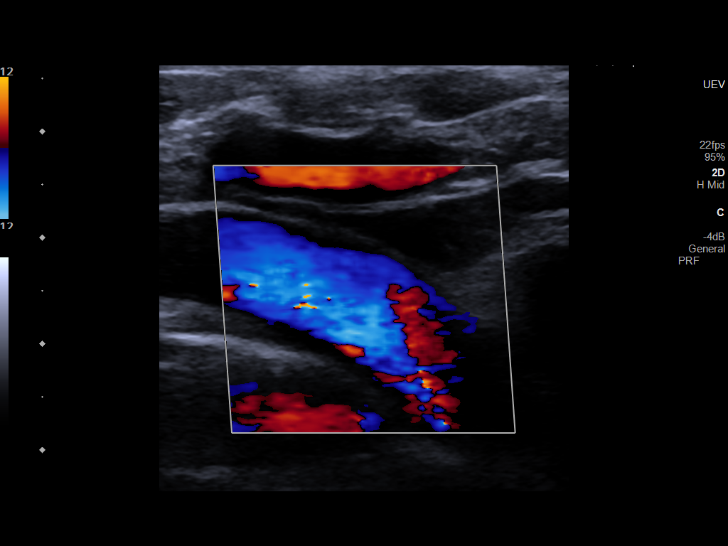
[im 9/34]
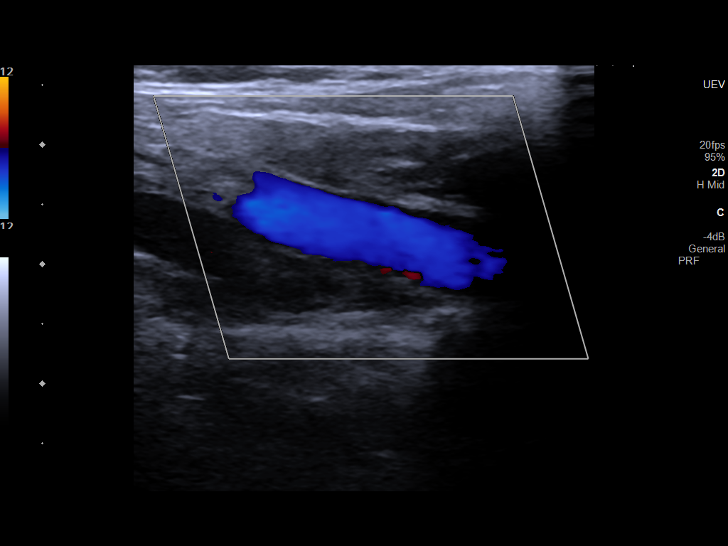
[im 12/34]
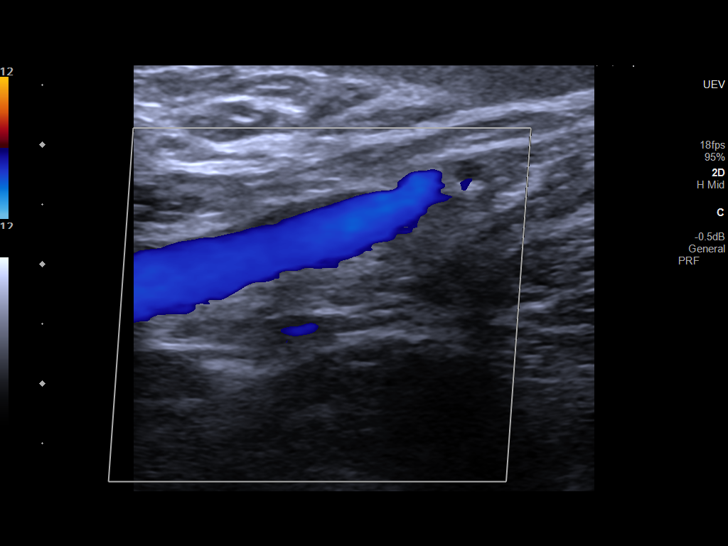
[im 15/34]
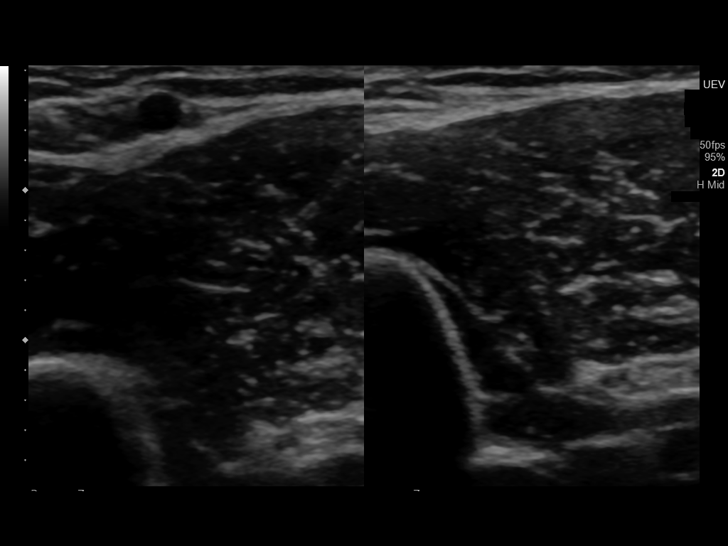
[im 18/34]
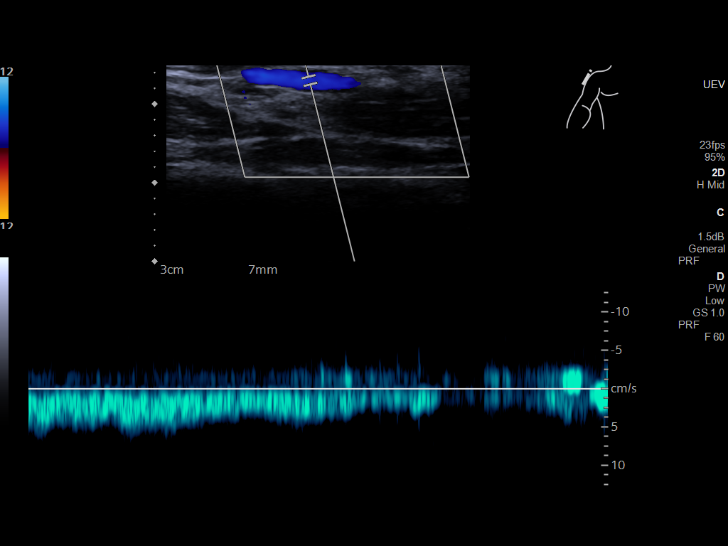
[im 19/34]
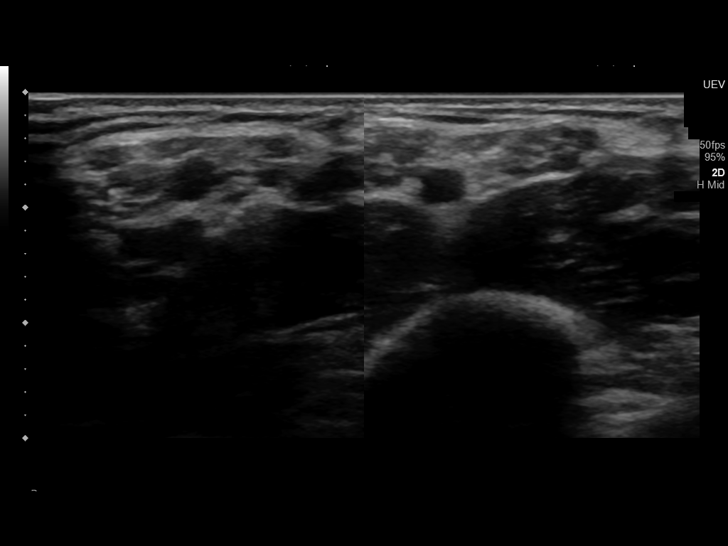
[im 22/34]
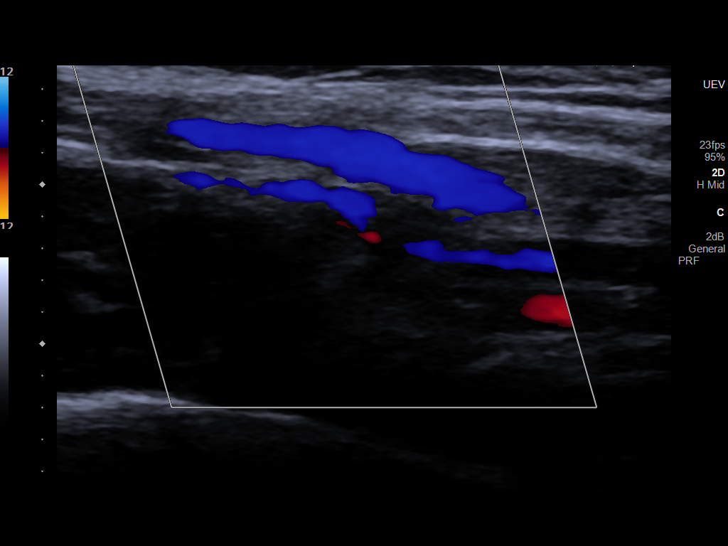
[im 25/34]
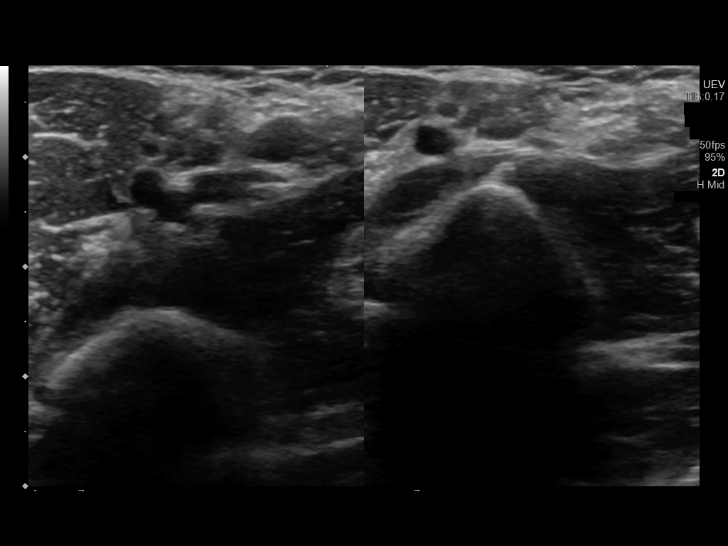
[im 28/34]
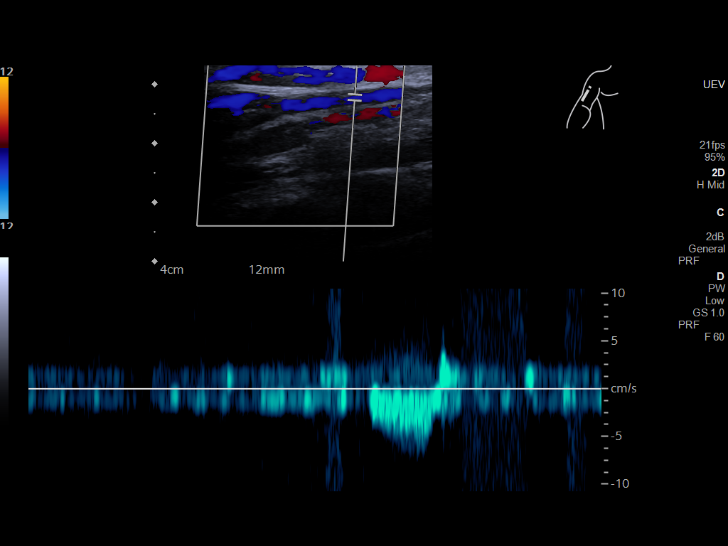
[im 31/34]
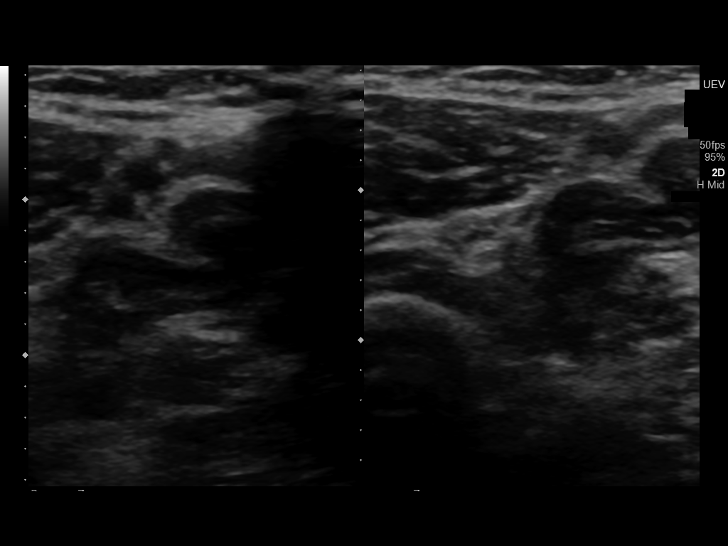
[im 34/34]
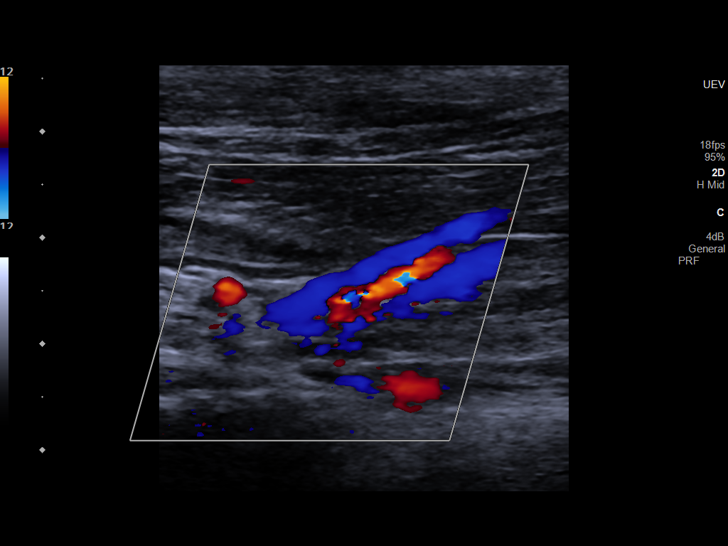

[13 of 24 positions shown; findings below may reference images not displayed]

FINDINGS: Contralateral Subclavian Vein: Respiratory phasicity is normal and
symmetric with the symptomatic side. No evidence of thrombus. Normal
compressibility.

Internal Jugular Vein: No evidence of thrombus. Normal
compressibility, respiratory phasicity and response to augmentation.

Subclavian Vein: No evidence of thrombus. Normal compressibility,
respiratory phasicity and response to augmentation.

Axillary Vein: No evidence of thrombus. Normal compressibility,
respiratory phasicity and response to augmentation.

Cephalic Vein: No evidence of thrombus. Normal compressibility,
respiratory phasicity and response to augmentation.

Basilic Vein: No evidence of thrombus. Normal compressibility,
respiratory phasicity and response to augmentation.

Brachial Veins: No evidence of thrombus. Normal compressibility,
respiratory phasicity and response to augmentation.

Radial Veins: No evidence of thrombus. Normal compressibility,
respiratory phasicity and response to augmentation.

Ulnar Veins: No evidence of thrombus. Normal compressibility,
respiratory phasicity and response to augmentation.

Venous Reflux:  None visualized.

Other Findings: No evidence of superficial thrombophlebitis or
abnormal fluid collection.
IMPRESSION: No evidence of DVT within the right upper extremity.

## 2022-10-16 DIAGNOSIS — F331 Major depressive disorder, recurrent, moderate: Secondary | ICD-10-CM | POA: Diagnosis not present

## 2022-11-04 ENCOUNTER — Encounter: Payer: Self-pay | Admitting: *Deleted

## 2022-11-04 NOTE — Progress Notes (Signed)
Pt attended 09/17/22 screening event where her b/p was 99/71 and her blood sugar was 93. At the event, the pt noted her PCP was "[facility]" and identified food, utility and safety insecurities, noting her hip pain had stopped her from being able to work since June, 2024. During the event f/u call with the pt, the pt shared she still intended to use [employer] at [facility] for her PCP and was hoping her former PCP, Diana Eves was still there. Pt stated she was trying to find [facility] support and needed specific testing, so was going to try to go thru [facility] to get help bc all the sites she had called, including [facility], [facility], [facility], all had 3-6 months waiting period and she needed the testing ASAP to be able to quality for additional support. She preferred to call [facility] directly herself. This health equity team member offered to mail the [employer] to try to help pt access food and meals to supplement her food stamps and to try to get additional support with pull-ups for her 36 year old (she currently goes to the [employer] for supplies.) She stated "when I can get to a place where I can care for myself, and child, then I want to volunteer to help like others have tried to help me."  Pt currently seeing [facility] specialists for ortho care. Pt advised to f/u with [facility] not only for primary care, but also to get support from the [facility] CM programs, which pt states she is going to call "soon." Additional pt f/u to be scheduled per health equity protocol.

## 2022-11-13 DIAGNOSIS — F331 Major depressive disorder, recurrent, moderate: Secondary | ICD-10-CM | POA: Diagnosis not present

## 2022-12-23 ENCOUNTER — Encounter: Payer: Self-pay | Admitting: *Deleted

## 2022-12-23 NOTE — Progress Notes (Signed)
Pt attended 09/17/22 screening event where her b/p was 99/71 and her blood sugar was 93. At the event, the pt noted her PCP was "[facility]" and identified food, utility and safety insecurities, noting her hip pain had stopped her from being able to work since January, 2024. During the initial event f/u call with the pt, the pt shared she still intended to use [employer] at [facility] for her PCP and was hoping her former PCP, Diana Eves was still there. Pt stated she was trying to find [facility] support and needed specific testing, so was going to try to go thru [facility] to get help bc all the sites she had called, including [facility], [facility], [facility], all had 3-6 months waiting period and she needed the testing ASAP to be able to quality for additional support. She preferred to call [facility] directly herself. This health equity team member offered to mail the [employer] to try to help pt access food and meals to supplement her food stamps and to try to get additional support with pull-ups for her 36 year old (she currently goes to the [employer] for supplies.) She stated "when I can get to a place where I can care for myself, and child, then I want to volunteer to help like others have tried to help me." Since pt's PCP does not document in EPIC and there are no [facility]-visible PCP or other healthcare appt since the initial event and f/u call, contact attempted again with pt but unable to connect with her by phone. During this 60 day f/u, health equity team member unable to contact pt by phone but contact info for PCP office included since pt kept 06/24/22 PCP appt but missed her 11/29/22 appt. Also included in this current f/u  letter was [employer] mental health resources and eval sites and times, and [employer] in [place] that had additional food and diaper resources. Additional pt f/u to be scheduled per health equity protocol

## 2023-02-20 DIAGNOSIS — Z111 Encounter for screening for respiratory tuberculosis: Secondary | ICD-10-CM | POA: Diagnosis not present

## 2023-02-22 DIAGNOSIS — Z111 Encounter for screening for respiratory tuberculosis: Secondary | ICD-10-CM | POA: Diagnosis not present

## 2023-04-21 ENCOUNTER — Encounter: Payer: Self-pay | Admitting: *Deleted

## 2023-04-21 NOTE — Progress Notes (Signed)
 Pt attended 09/17/22 screening event where her b/p was 99/71 and her blood sugar was 93. At the event, the pt noted her PCP was "[facility]" and identified food, utility and safety insecurities, noting her hip pain had stopped her from being able to work since January, 2024. She did note having insurance and being a former smoker. During 6 month f/u call today, pt confirmed she has re-established care with the [facility]), did indeed connect to [employer] for diaper and other childcare supplies the first Thursday of each month, is continuing to use the [employer] to supplement her food stamps. She noted she had a future appt at her PCP clinic on March 18, and had counseling services but providers noted do not appear to use [facility]-visible encounter documentation. Today pt asked for info on [employer] for pre-school services for her child. Letter sent to pt with [employer] contact info and reminded that [employer] also has resources for her child for education and community school contact info. Pt stated on call today that she is about to start working for the [employer] and hopes to have insurance and additional resource options once she starts work. No additional health equity team support scheduled at this time.

## 2023-05-12 DIAGNOSIS — T782XXA Anaphylactic shock, unspecified, initial encounter: Secondary | ICD-10-CM | POA: Diagnosis not present

## 2023-08-07 ENCOUNTER — Ambulatory Visit: Admitting: Nurse Practitioner

## 2023-08-07 VITALS — BP 113/76 | HR 67

## 2023-08-07 DIAGNOSIS — A599 Trichomoniasis, unspecified: Secondary | ICD-10-CM

## 2023-08-07 DIAGNOSIS — Z01419 Encounter for gynecological examination (general) (routine) without abnormal findings: Secondary | ICD-10-CM

## 2023-08-07 DIAGNOSIS — Z113 Encounter for screening for infections with a predominantly sexual mode of transmission: Secondary | ICD-10-CM

## 2023-08-07 DIAGNOSIS — Z3009 Encounter for other general counseling and advice on contraception: Secondary | ICD-10-CM

## 2023-08-07 LAB — WET PREP FOR TRICH, YEAST, CLUE
Clue Cell Exam: NEGATIVE
Trichomonas Exam: POSITIVE — AB
Yeast Exam: NEGATIVE

## 2023-08-07 LAB — HM HIV SCREENING LAB: HM HIV Screening: NEGATIVE

## 2023-08-07 LAB — HM HEPATITIS C SCREENING LAB: HM Hepatitis Screen: NEGATIVE

## 2023-08-07 MED ORDER — METRONIDAZOLE 500 MG PO TABS: 500.0000 mg | ORAL_TABLET | Freq: Two times a day (BID) | ORAL | Status: AC

## 2023-08-07 NOTE — Progress Notes (Signed)
 Patient is here for family planning visit. Family planning education card given to patient. Wet prep results reviewed; patient treated with Metronidazole 500mg  BID x 7 days per order by JINNY Narrow, NP. I provided counseling today regarding the medication. We discussed the medication, the side effects and when to call clinic. Condoms declined. All questions answered and verbalizes understanding.   Doyce CINDERELLA Shuck, RN

## 2023-08-08 LAB — HBV ANTIGEN/ANTIBODY STATE LAB
Hep B Core Total Ab: NONREACTIVE
Hep B S Ab: REACTIVE
Hepatitis B Surface Antigen: NONREACTIVE

## 2023-08-09 LAB — IGP, APTIMA HPV
HPV Aptima: NEGATIVE
PAP Smear Comment: 0

## 2023-08-09 LAB — SPECIMEN STATUS REPORT

## 2023-08-14 ENCOUNTER — Ambulatory Visit: Payer: Self-pay | Admitting: Nurse Practitioner

## 2023-08-14 NOTE — Progress Notes (Signed)
 Wet Prep treated in office DOS for positive trich. Elizabeth L. Timothee Gali, FNP-C

## 2023-08-14 NOTE — Progress Notes (Signed)
 Current cervical cytology result with HPV co-testing: NILM, HPV (-). 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 With current result and history, ASCCP recommends 1 year cervical cytology f/u with HPV co-testing until 3 consecutive negative tests.   Please send letter to patient advising cervical cytology with co-testing due in 1 year.  Thank you,  Clarita L. Mickle Campton, FNP-C

## 2023-08-20 ENCOUNTER — Encounter: Payer: Self-pay | Admitting: Nurse Practitioner

## 2023-08-20 NOTE — Progress Notes (Signed)
 [employer] [facility] DEPART[facility] [address] [place] [address] Main phone: [phone]  Family Planning Visit - Repeat Yearly Visit  Subjective:  Elizabeth Mcneil is a 38 y.o. [phone]  being seen today for an annual wellness visit and to discuss contraception options. The patient is currently using hormonal implant for pregnancy prevention. Patient does not want a pregnancy in the next year.   Patient reports they are looking for a method with the following characteristics:  High efficacy at preventing pregnancy  Patient has the following medical problems:  Patient Active Problem List   Diagnosis Date Noted   Indication for care in labor and delivery, antepartum 02/19/2020   Pregnancy 02/18/2020   Abdominal pain in pregnancy, third trimester 02/18/2020   Pelvic pain in pregnancy, antepartum, third trimester 02/18/2020   Chief Complaint  Patient presents with   Annual Exam    Pt is here PE, Nexplanon removal and reinsertion    HPI Patient reports to clinic today to have hormonal implant removed. Implant is good for another year and not due to be removed.  Patient would like STI testing and PAP while in office today. Patient has concern of headaches that she attributes to missing meals and stress. She also reports vision changes she also attributes to stress.  Patient endorses she may be losing her housing soon and sometimes has difficulty getting food.   Review of Systems  Constitutional:  Negative for weight loss.  HENT:  Negative for sore throat.   Eyes:  Positive for blurred vision.  Respiratory:  Negative for cough, shortness of breath and wheezing.   Cardiovascular:  Negative for chest pain and claudication.  Gastrointestinal:  Negative for nausea and vomiting.  Genitourinary:  Negative for dysuria and frequency.  Skin:  Negative for rash.  Neurological:  Positive for headaches. Negative for dizziness and seizures.   Endo/Heme/Allergies:  Does not bruise/bleed easily.    See flowsheet for further details and programmatic requirements Hyperlink available at the top of the signed note in blue.  Flow sheet content below:  Pregnancy Intention Screening Does the patient want to become pregnant in the next year?: No Does the patient's partner want to become pregnant in the next year?: No Would the patient like to discuss contraceptive options today?: Yes Contraception History Past methods of contraception used by patient:: Hormonal Implant Adverse effects associated with Hormonal Implant: none Sexual History What age did you start your period?: 12 How often do you have your period?: irregular Date of last sex?:  (2 years ago) Has the patient had unprotected sex within the last 5 days?: No Do you have sex with men, women, both men and women?: Men only In the past 2 months how many partners have you had sex with?: 0 In the past 12 months, how many partners have you had sex with?: 0 Is it possible that any of your sex partners in the past 12 months had sex with someone else whild they were still in a sexual relationship with you?: No What ways do you have sex?: N/A Do you or your partner use condoms and/or dental dams every time you have vaginal, oral or anal sex?: Declined Do you douche?: No Date of last HIV test?: 12/29/20 Have you ever had an STD?: Yes Have any of your partners had an STD?: Yes Partner Previous STD?: Gonorrhea, Syphilis Date?:  (2014) Have you or your partner ever shot up drugs?: No Have any of your partners  used drugs in the past?: No Have you or your partners exchanged money or drugs for sex?: No Risk Factors for Hep B Household, sexual, or needle sharing contact of a person infected with Hep B: No Sexual contact with a person who uses drugs not as prescribed?: No Currently or Ever used drugs not as prescribed: No HIV Positive: No PRep Patient: No Men who have sex with men:  No Have Hepatitis C: No History of Incarceration: No History of Homeslessness?: Yes Anal sex following anal drug use?: No Risk Factors for Hep C Currently using drugs not as prescribed: No Sexual partner(s) currently using drugs as not prescribed: No History of drug use: Yes HIV Positive: No People with a history of incarceration: No People born between the years of 69 and 23: No Hepatitis Counseling Hep B Counseling: Counseled patient about increased risk of Hep B and recommendation for testing, Patient accepts testing for Hep B today Hep C Counseling: Counseled patient about increased risk of Hep C and recommendation for testing, Patient accepts testing for Hep C today Counseling All Patients: LARCS discussed, Use specific methods of contraceoptive and identify adverse effects (R), Stop tobacco use, implementing the 5A counseling approach (R), Encourage mammagram for women 60 or older and younger than 50 if conditions support (R), Emergency Contraception Offered (R) if unprotected sex in past 5 days and/or propyhlactically as indicated., Provide emergency contraception counseling (R), Typical use rates for method effectiveness (R), Delay future pregnancy from 18 months to 5 years (R) at [facility] visit, Appropriate referral for additional services as needed (R) Education: Make informed decision about family planning, Reduce risk of transmission and protection from STD's and HIV, Understand BMI >25 or >18.5 is a health risk (weight management educational materials to be provided to client requests), Promoted daily consumption of MVI with folic acid if capable of conceiving., Results of physical assessment and labs (if performed), How to discontinue the method selected and information on back up method used, How to use the method selected and information on back up method used, How to use the method consistently and correctly, Teach back method completed, Warning signs for rare but serious adverse  events and what to do if they experience a warning sign (including emergency 24 hour number, where to seek emergency service outside of hours of operation), When to return for follow up (planned return schedule), Is patient pregnant?, PCP list given to patient Contraception Wrap Up Current Method: Hormonal Implant End Method: Hormonal Implant Contraception Counseling Provided: Yes How was the end contraceptive method provided?: N/A  Diabetes screening This patient is 38 y.o. with a BMI of There is no height or weight on file to calculate BMI..  Is patient eligible for diabetes screening (age >35 and BMI >25)?  no  Was Hgb A1c ordered? not applicable  STI screening Patient reports 1 of partners in last year.  Does this patient desire STI screening?  Yes  Hepatitis C screening Has patient been screened once for HCV in the past?  No  No results found for: HCVAB  Does the patient meet criteria for HCV testing? Yes  (If yes-- Screen for HCV through [facility]) Criteria:  Since the last HCV result, does the patient have any of the following? - Current drug use - Have a partner with drug use - Has been incarcerated  Hepatitis B screening Does the patient meet criteria for HBV testing? Yes Criteria:  -Household, sexual or needle sharing contact with HBV -History of drug  use -HIV positive -Those with known Hep C  Cervical Cancer Screening  Result Date Procedure Results Follow-ups  08/07/2023 IGP, Aptima HPV DIAGNOSIS:: Comment Specimen adequacy:: Comment Clinician Provided ICD10: Comment Performed by:: Comment PAP Smear Comment: . Note:: Comment Test Methodology: Comment HPV Aptima: Negative     Health Maintenance Due  Topic Date Due   Pneumococcal Vaccine 86-57 Years old (1 of 2 - PCV) Never done   Hepatitis B Vaccines (1 of 3 - 19+ 3-dose series) Never done   HPV VACCINES (1 - 3-dose SCDM series) Never done   COVID-19 Vaccine (1 - 2024-25 season) Never done    The  following portions of the patient's history were reviewed and updated as appropriate: allergies, current medications, past family history, past medical history, past social history, past surgical history and problem list. Problem list updated.  Objective:   Vitals:   08/07/23 0943  BP: 113/76  Pulse: 67    Physical Exam Vitals and nursing note reviewed.  Constitutional:      Appearance: Normal appearance.  HENT:     Head: Normocephalic.     Salivary Glands: Right salivary gland is not diffusely enlarged or tender. Left salivary gland is not diffusely enlarged or tender.     Mouth/Throat:     Lips: Pink. No lesions.     Mouth: Mucous membranes are moist.     Tongue: No lesions. Tongue does not deviate from midline.     Pharynx: Oropharynx is clear. Uvula midline. No oropharyngeal exudate or posterior oropharyngeal erythema.     Tonsils: No tonsillar exudate.  Eyes:     General:        Right eye: No discharge.        Left eye: No discharge.  Neck:     Thyroid: No thyroid mass or thyroid tenderness.     Trachea: Trachea and phonation normal. No tracheal tenderness or tracheal deviation.  Cardiovascular:     Rate and Rhythm: Normal rate and regular rhythm.     Heart sounds: Normal heart sounds, S1 normal and S2 normal.  Pulmonary:     Effort: Pulmonary effort is normal.     Breath sounds: Normal breath sounds and air entry.  Abdominal:     General: Abdomen is flat. Bowel sounds are normal.     Palpations: Abdomen is soft.     Tenderness: There is no abdominal tenderness. There is no guarding or rebound.  Genitourinary:    General: Normal vulva.     Exam position: Lithotomy position.     Pubic Area: No rash or pubic lice.      Tanner stage (genital): 5.     Labia:        Right: No rash, tenderness, lesion or injury.        Left: No rash, tenderness, lesion or injury.      Vagina: Normal. No signs of injury and foreign body. No vaginal discharge, erythema, tenderness,  bleeding or lesions.     Cervix: Normal. No cervical motion tenderness, discharge, friability, lesion, erythema, cervical bleeding or eversion.     Uterus: Normal.      Adnexa: Right adnexa normal and left adnexa normal.     Comments: pH<4.5   Lymphadenopathy:     Head:     Right side of head: No submental, submandibular, tonsillar, preauricular or posterior auricular adenopathy.     Left side of head: No submental, submandibular, tonsillar, preauricular or posterior auricular adenopathy.     Cervical:  No cervical adenopathy.     Right cervical: No superficial or posterior cervical adenopathy.    Left cervical: No superficial or posterior cervical adenopathy.     Upper Body:     Right upper body: No supraclavicular or axillary adenopathy.     Left upper body: No supraclavicular or axillary adenopathy.     Lower Body: No right inguinal adenopathy. No left inguinal adenopathy.  Skin:    General: Skin is warm and dry.     Findings: No lesion or rash.     Comments: Skin tone appropriate for ethnicity.   Neurological:     Mental Status: She is alert and oriented to person, place, and time.  Psychiatric:        Attention and Perception: Attention and perception normal.        Mood and Affect: Mood and affect normal.        Speech: Speech normal.        Behavior: Behavior normal. Behavior is cooperative.        Thought Content: Thought content normal.     Assessment and Plan:  Elizabeth Mcneil is a 38 y.o. female [phone] presenting to the [facility] Department for an yearly wellness and contraception visit  1. Family planning (Primary) Nexplanon not expired and safe to stay in for 1 more year.   Contraception counseling:  Reviewed options based on patient desire and reproductive life plan. Patient is interested in Hormonal Implant which is currently in place.   Risks, benefits, and typical effectiveness rates were reviewed.  Questions were answered.  Written information  was also given to the patient to review.    The patient will follow up in  1 years for surveillance.  The patient was told to call with any further questions, or with any concerns about this method of contraception.  Emphasized use of condoms 100% of the time for STI prevention.  Emergency Contraception Precautions (ECP): Patient assessed for need of ECP. She is not a candidate based on LARC in place and unexpired .   2. Well woman exam with routine gynecological exam PAP completed today.  Patient encouraged to see her PCP regarding headaches, and vision changes.  Patient given a list of resources and encouraged to visit DSS to obtain food today while in clinic.   - IGP, Aptima HPV  3. Screening for venereal disease  - Chlamydia/Gonorrhea [place] - HBV Antigen/Antibody [facility] - HIV/HCV [place] [facility] - Syphilis Serology, [facility] - WET PREP FOR TRICH, YEAST, CLUE  4. Trichomoniasis  - metroNIDAZOLE  (FLAGYL ) 500 MG tablet; Take 1 tablet (500 mg total) by mouth 2 (two) times daily.   Return in about 1 year (around 08/06/2024) for annual well-woman exam.  No future appointments.  Clarita LITTIE Narrow, NP

## 2023-09-15 ENCOUNTER — Ambulatory Visit: Payer: MEDICAID

## 2023-09-18 ENCOUNTER — Ambulatory Visit: Payer: MEDICAID

## 2023-11-16 ENCOUNTER — Other Ambulatory Visit: Payer: Self-pay

## 2023-11-16 ENCOUNTER — Emergency Department
Admission: EM | Admit: 2023-11-16 | Discharge: 2023-11-16 | Disposition: A | Discharge status: Home / Self Care | Payer: MEDICAID | Attending: Emergency Medicine | Admitting: Emergency Medicine

## 2023-11-16 ENCOUNTER — Emergency Department: Payer: MEDICAID

## 2023-11-16 DIAGNOSIS — M25552 Pain in left hip: Secondary | ICD-10-CM | POA: Diagnosis present

## 2023-11-16 DIAGNOSIS — Z96642 Presence of left artificial hip joint: Secondary | ICD-10-CM | POA: Diagnosis not present

## 2023-11-16 DIAGNOSIS — Y92410 Unspecified street and highway as the place of occurrence of the external cause: Secondary | ICD-10-CM | POA: Diagnosis not present

## 2023-11-16 MED ORDER — NAPROXEN 500 MG PO TABS
500.0000 mg | ORAL_TABLET | Freq: Two times a day (BID) | ORAL | 2 refills | Status: AC
Start: 2023-11-16 — End: ?
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
